# Patient Record
Sex: Male | Born: 1985 | Race: White | Hispanic: No | Marital: Married | Smoking: Current every day smoker
Health system: Southern US, Community
[De-identification: ages and names within clinical notes are randomized; demographics above are authoritative.]

## PROBLEM LIST (undated history)

## (undated) DIAGNOSIS — R002 Palpitations: Secondary | ICD-10-CM

## (undated) DIAGNOSIS — IMO0001 Reserved for inherently not codable concepts without codable children: Secondary | ICD-10-CM

## (undated) DIAGNOSIS — R030 Elevated blood-pressure reading, without diagnosis of hypertension: Secondary | ICD-10-CM

## (undated) HISTORY — DX: Reserved for inherently not codable concepts without codable children: IMO0001

## (undated) HISTORY — PX: TYMPANOSTOMY TUBE PLACEMENT: SHX32

## (undated) HISTORY — DX: Palpitations: R00.2

## (undated) HISTORY — DX: Elevated blood-pressure reading, without diagnosis of hypertension: R03.0

---

## 2006-07-13 HISTORY — PX: KNEE SURGERY: SHX244

## 2007-03-02 ENCOUNTER — Emergency Department (HOSPITAL_COMMUNITY): Admission: EM | Admit: 2007-03-02 | Discharge: 2007-03-02 | Payer: Self-pay | Admitting: Emergency Medicine

## 2007-03-19 ENCOUNTER — Ambulatory Visit: Payer: Self-pay | Admitting: Emergency Medicine

## 2007-09-28 ENCOUNTER — Emergency Department: Payer: Self-pay | Admitting: Emergency Medicine

## 2007-09-28 ENCOUNTER — Other Ambulatory Visit: Payer: Self-pay

## 2007-10-18 ENCOUNTER — Emergency Department: Payer: Self-pay | Admitting: Emergency Medicine

## 2007-10-25 ENCOUNTER — Emergency Department: Payer: Self-pay | Admitting: Emergency Medicine

## 2008-05-05 ENCOUNTER — Emergency Department: Payer: Self-pay | Admitting: Emergency Medicine

## 2008-10-16 ENCOUNTER — Emergency Department: Payer: Self-pay | Admitting: Emergency Medicine

## 2008-11-21 ENCOUNTER — Emergency Department: Payer: Self-pay | Admitting: Emergency Medicine

## 2008-12-24 ENCOUNTER — Ambulatory Visit: Payer: Self-pay | Admitting: Family Medicine

## 2008-12-24 DIAGNOSIS — R002 Palpitations: Secondary | ICD-10-CM

## 2008-12-24 DIAGNOSIS — M25579 Pain in unspecified ankle and joints of unspecified foot: Secondary | ICD-10-CM | POA: Insufficient documentation

## 2008-12-24 DIAGNOSIS — M79609 Pain in unspecified limb: Secondary | ICD-10-CM

## 2008-12-24 DIAGNOSIS — R000 Tachycardia, unspecified: Secondary | ICD-10-CM

## 2009-01-02 ENCOUNTER — Encounter: Payer: Self-pay | Admitting: Cardiology

## 2009-01-02 ENCOUNTER — Ambulatory Visit: Payer: Self-pay | Admitting: Cardiology

## 2009-01-11 ENCOUNTER — Telehealth: Payer: Self-pay | Admitting: Cardiology

## 2009-01-11 LAB — CONVERTED CEMR LAB
CO2: 22 meq/L (ref 19–32)
Calcium: 9.9 mg/dL (ref 8.4–10.5)
Chloride: 105 meq/L (ref 96–112)
Creatinine, Ser: 0.93 mg/dL (ref 0.40–1.50)
Glucose, Bld: 76 mg/dL (ref 70–99)
Sodium: 139 meq/L (ref 135–145)
TSH: 2.621 microintl units/mL (ref 0.350–4.500)

## 2009-01-13 ENCOUNTER — Emergency Department: Payer: Self-pay | Admitting: Emergency Medicine

## 2009-01-22 ENCOUNTER — Ambulatory Visit: Payer: Self-pay | Admitting: Family Medicine

## 2009-01-22 DIAGNOSIS — R21 Rash and other nonspecific skin eruption: Secondary | ICD-10-CM

## 2009-01-24 ENCOUNTER — Encounter: Payer: Self-pay | Admitting: Cardiology

## 2009-02-14 ENCOUNTER — Encounter: Payer: Self-pay | Admitting: Cardiology

## 2009-03-11 ENCOUNTER — Ambulatory Visit: Payer: Self-pay | Admitting: Cardiology

## 2009-04-01 ENCOUNTER — Ambulatory Visit: Payer: Self-pay | Admitting: Family Medicine

## 2009-04-01 DIAGNOSIS — K644 Residual hemorrhoidal skin tags: Secondary | ICD-10-CM | POA: Insufficient documentation

## 2009-10-09 ENCOUNTER — Ambulatory Visit (HOSPITAL_COMMUNITY): Admission: RE | Admit: 2009-10-09 | Discharge: 2009-10-09 | Payer: Self-pay | Admitting: Orthopedic Surgery

## 2010-12-19 ENCOUNTER — Encounter: Payer: Self-pay | Admitting: Cardiovascular Disease

## 2011-04-10 IMAGING — CR DG FOOT COMPLETE 3+V*L*
1 series · 3 of 3 positions shown · non-contrast
Comparison: none

REASON FOR EXAM: fall off horse,extreme dorsiflexion, now ongoing pain -
mid foot
COMMENTS:

[Series 1: view not recorded · 0.17mm/px · 3 of 3 slices shown]
[im 1/3]
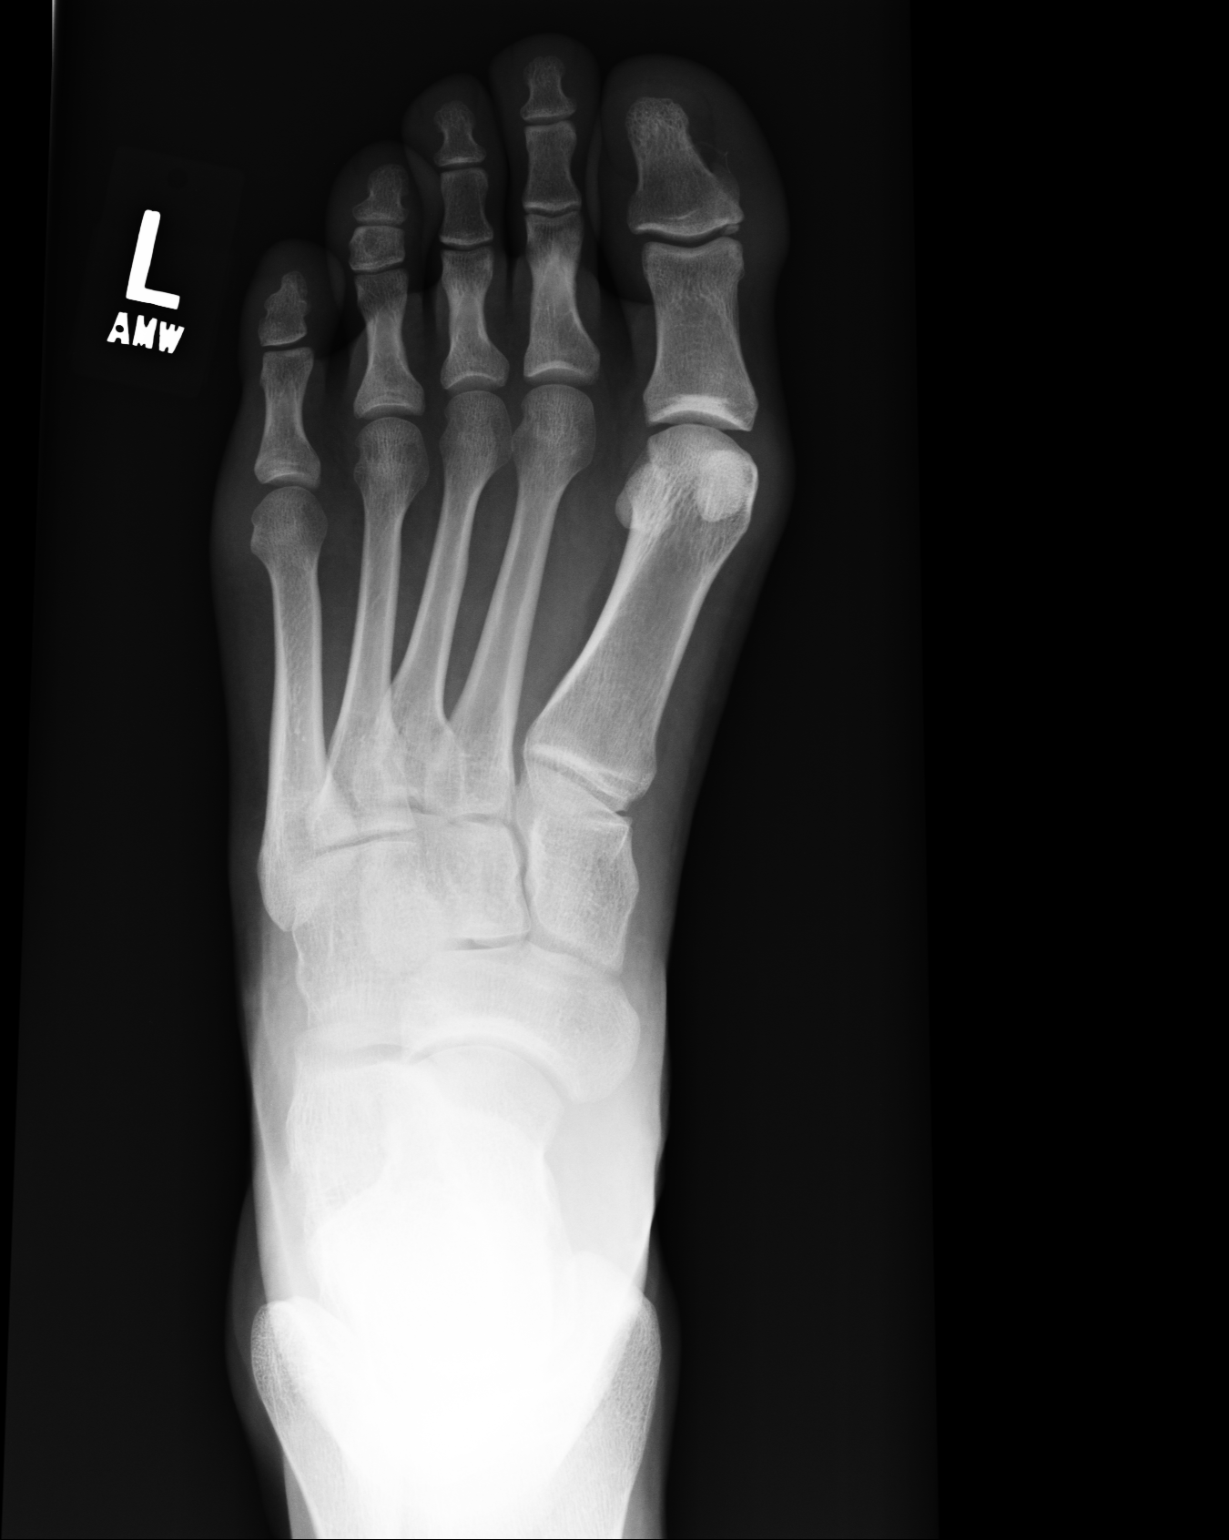
[im 2/3]
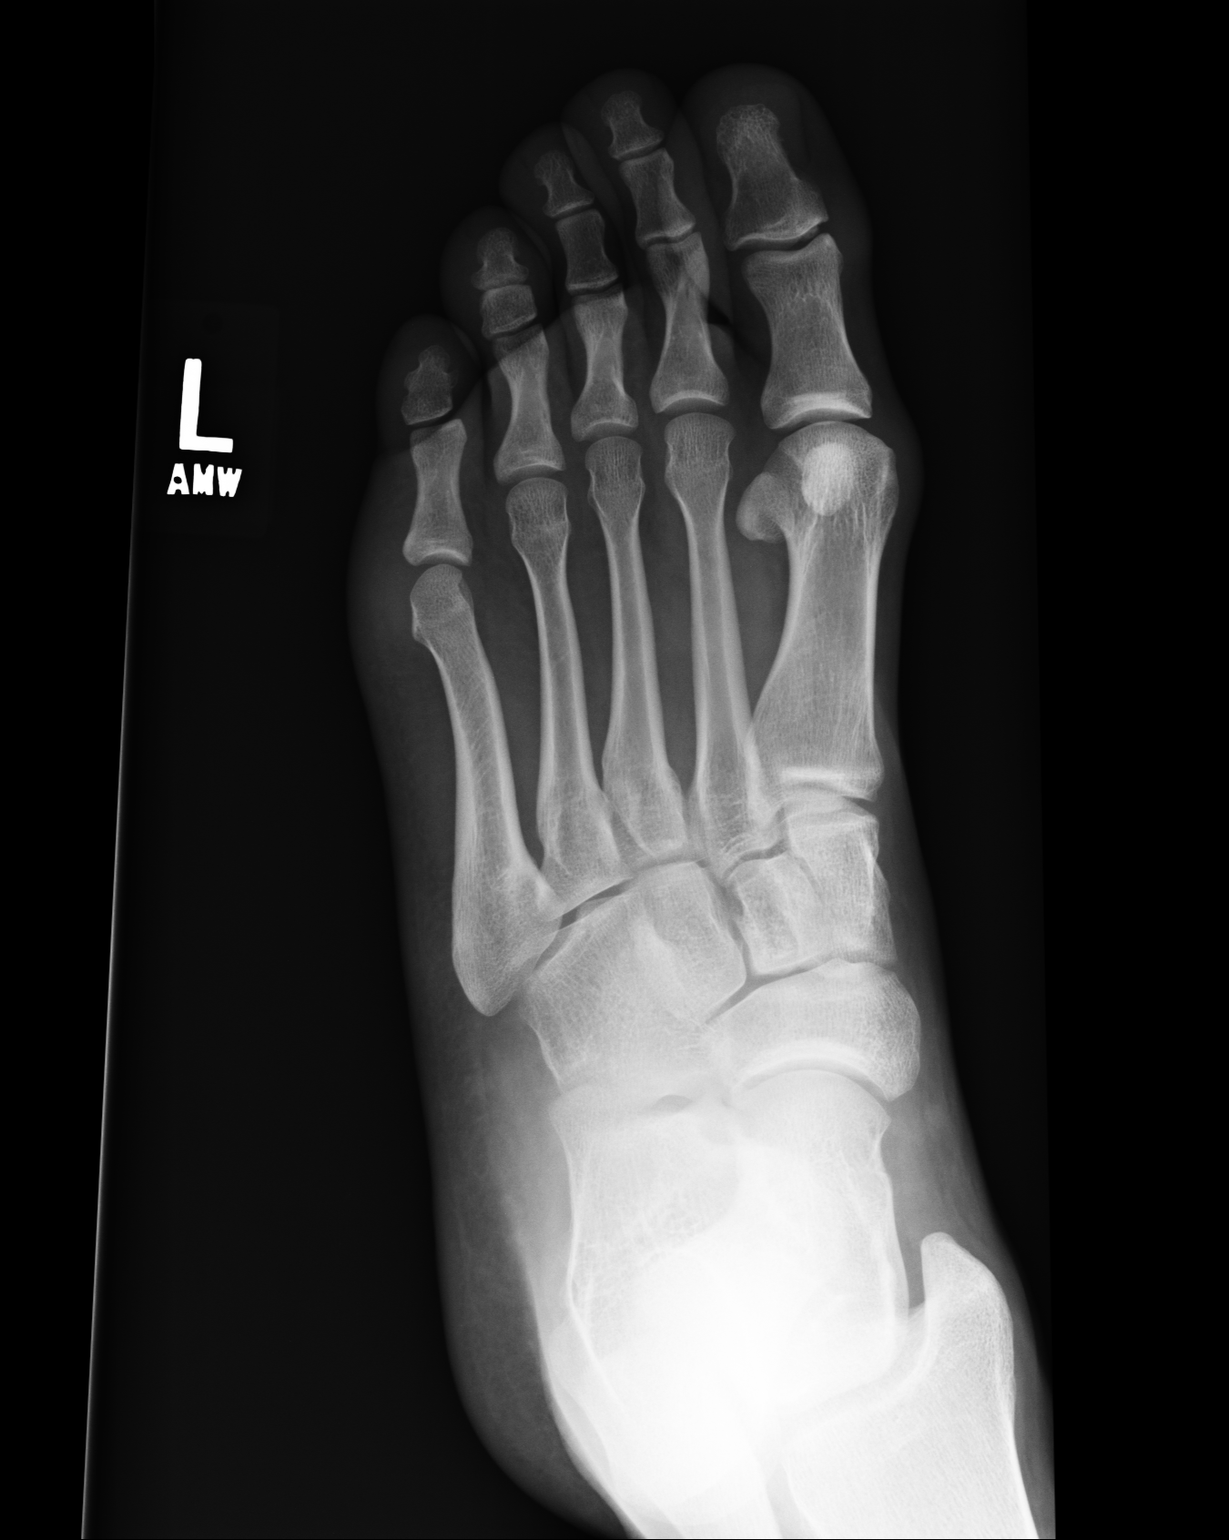
[im 3/3]
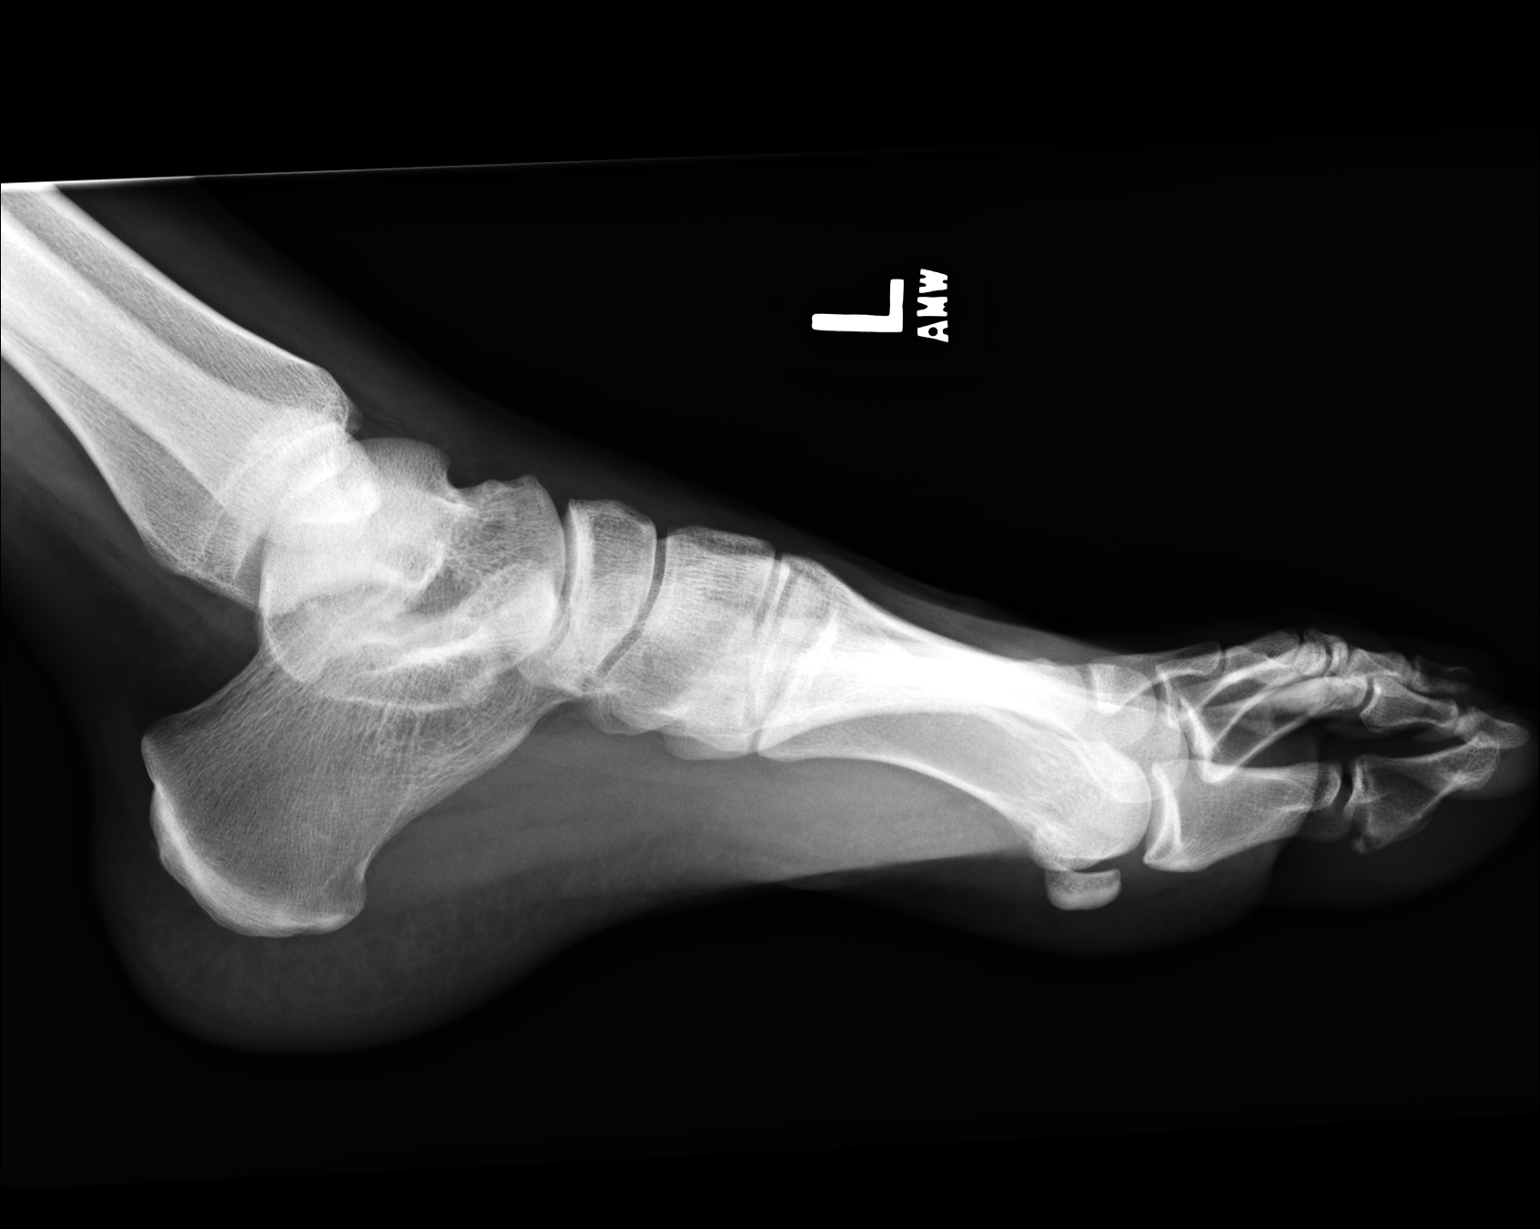

[3 of 3 positions shown; findings below may reference images not displayed]

PROCEDURE:     DXR - DXR FOOT LT COMP W/OBLIQUES  - November 21, 2008  [DATE]

RESULT:     Three views of the left foot reveal the phalanges and
metatarsals to be intact. The distal tarsal bones also appear normal. The
calcaneus is intact. As best as can be determined the talus is intact. The
overlying soft tissues appear within the limits of normal.
IMPRESSION: I do not see objective evidence of an acute fracture of the
bones of the foot. If the patient's symptoms warrant further evaluation, CT
scanning would be a useful next step.

## 2014-02-24 ENCOUNTER — Encounter (HOSPITAL_COMMUNITY): Payer: Self-pay | Admitting: Emergency Medicine

## 2014-02-24 ENCOUNTER — Emergency Department (HOSPITAL_COMMUNITY)
Admission: EM | Admit: 2014-02-24 | Discharge: 2014-02-24 | Disposition: A | Discharge status: Home / Self Care | Payer: Self-pay | Attending: Emergency Medicine | Admitting: Emergency Medicine

## 2014-02-24 DIAGNOSIS — F17200 Nicotine dependence, unspecified, uncomplicated: Secondary | ICD-10-CM | POA: Insufficient documentation

## 2014-02-24 DIAGNOSIS — R21 Rash and other nonspecific skin eruption: Secondary | ICD-10-CM | POA: Insufficient documentation

## 2014-02-24 DIAGNOSIS — IMO0002 Reserved for concepts with insufficient information to code with codable children: Secondary | ICD-10-CM | POA: Insufficient documentation

## 2014-02-24 DIAGNOSIS — J45909 Unspecified asthma, uncomplicated: Secondary | ICD-10-CM | POA: Insufficient documentation

## 2014-02-24 MED ORDER — PREDNISONE 50 MG PO TABS: ORAL_TABLET | ORAL | Status: DC

## 2014-02-24 NOTE — Discharge Instructions (Signed)

## 2014-02-24 NOTE — ED Notes (Signed)
Pt reports itching  Red rash that developed on bilateral lower legs 1 week ago. States that is has progressively moved "all over my body" with exception of groin area and face. Denies pain. States "I have tried everything and it won't go away." States he works outside.
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
## 2014-06-04 ENCOUNTER — Emergency Department: Payer: Self-pay | Admitting: Emergency Medicine

## 2014-06-29 ENCOUNTER — Emergency Department: Payer: Self-pay | Admitting: Emergency Medicine

## 2014-09-07 ENCOUNTER — Encounter (HOSPITAL_BASED_OUTPATIENT_CLINIC_OR_DEPARTMENT_OTHER): Payer: Self-pay

## 2014-09-07 ENCOUNTER — Emergency Department (HOSPITAL_BASED_OUTPATIENT_CLINIC_OR_DEPARTMENT_OTHER)
Admission: EM | Admit: 2014-09-07 | Discharge: 2014-09-07 | Disposition: A | Discharge status: Home / Self Care | Payer: Self-pay | Attending: Emergency Medicine | Admitting: Emergency Medicine

## 2014-09-07 DIAGNOSIS — Z720 Tobacco use: Secondary | ICD-10-CM | POA: Insufficient documentation

## 2014-09-07 DIAGNOSIS — J45909 Unspecified asthma, uncomplicated: Secondary | ICD-10-CM | POA: Insufficient documentation

## 2014-09-07 DIAGNOSIS — R51 Headache: Secondary | ICD-10-CM | POA: Insufficient documentation

## 2014-09-07 DIAGNOSIS — K529 Noninfective gastroenteritis and colitis, unspecified: Secondary | ICD-10-CM | POA: Insufficient documentation

## 2014-09-07 LAB — CBC WITH DIFFERENTIAL/PLATELET
BASOS ABS: 0 10*3/uL (ref 0.0–0.1)
Basophils Relative: 0 % (ref 0–1)
EOS ABS: 0.1 10*3/uL (ref 0.0–0.7)
EOS PCT: 1 % (ref 0–5)
HEMATOCRIT: 53 % — AB (ref 39.0–52.0)
Hemoglobin: 18.3 g/dL — ABNORMAL HIGH (ref 13.0–17.0)
LYMPHS PCT: 11 % — AB (ref 12–46)
Lymphs Abs: 1.1 10*3/uL (ref 0.7–4.0)
MCH: 30.1 pg (ref 26.0–34.0)
MCHC: 34.5 g/dL (ref 30.0–36.0)
MCV: 87.3 fL (ref 78.0–100.0)
MONO ABS: 1.2 10*3/uL — AB (ref 0.1–1.0)
Monocytes Relative: 12 % (ref 3–12)
Neutro Abs: 8 10*3/uL — ABNORMAL HIGH (ref 1.7–7.7)
Neutrophils Relative %: 76 % (ref 43–77)
PLATELETS: 164 10*3/uL (ref 150–400)
RBC: 6.07 MIL/uL — ABNORMAL HIGH (ref 4.22–5.81)
RDW: 13.7 % (ref 11.5–15.5)
WBC: 10.5 10*3/uL (ref 4.0–10.5)

## 2014-09-07 LAB — HEPATIC FUNCTION PANEL
ALBUMIN: 4.4 g/dL (ref 3.5–5.2)
ALK PHOS: 76 U/L (ref 39–117)
ALT: 26 U/L (ref 0–53)
AST: 24 U/L (ref 0–37)
BILIRUBIN DIRECT: 0.2 mg/dL (ref 0.0–0.5)
BILIRUBIN TOTAL: 0.9 mg/dL (ref 0.3–1.2)
Indirect Bilirubin: 0.7 mg/dL (ref 0.3–0.9)
Total Protein: 7.9 g/dL (ref 6.0–8.3)

## 2014-09-07 LAB — BASIC METABOLIC PANEL
Anion gap: 2 — ABNORMAL LOW (ref 5–15)
BUN: 11 mg/dL (ref 6–23)
CHLORIDE: 107 mmol/L (ref 96–112)
CO2: 24 mmol/L (ref 19–32)
CREATININE: 0.96 mg/dL (ref 0.50–1.35)
Calcium: 8.9 mg/dL (ref 8.4–10.5)
GFR calc Af Amer: >90 mL/min (ref >90)
GFR calc non Af Amer: >90 mL/min (ref >90)
GLUCOSE: 100 mg/dL — AB (ref 70–99)
POTASSIUM: 3.7 mmol/L (ref 3.5–5.1)
Sodium: 133 mmol/L — ABNORMAL LOW (ref 135–145)

## 2014-09-07 LAB — LIPASE, BLOOD: Lipase: 21 U/L (ref 11–59)

## 2014-09-07 MED ORDER — SODIUM CHLORIDE 0.9 % IV BOLUS (SEPSIS)
1000.0000 mL | Freq: Once | INTRAVENOUS | Status: AC
  Administered 2014-09-07: 1000 mL via INTRAVENOUS

## 2014-09-07 MED ORDER — ONDANSETRON HCL 4 MG/2ML IJ SOLN
4.0000 mg | Freq: Once | INTRAMUSCULAR | Status: AC
  Administered 2014-09-07: 4 mg via INTRAVENOUS
  Filled 2014-09-07: qty 2

## 2014-09-07 MED ORDER — ONDANSETRON 4 MG PO TBDP: ORAL_TABLET | ORAL | Status: DC
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
## 2014-09-07 NOTE — ED Notes (Signed)
Dr. Criss AlvineGoldston requests pt not be d/c home until second liter of ivf is finished.

## 2014-09-07 NOTE — ED Notes (Signed)
Discharge instructions reviewed, signature obtained. Pt awaiting infusion of second liter of iv fluids for d/c. Will call this rn when bag has infused.

## 2014-09-07 NOTE — ED Notes (Signed)
MD at bedside. 

## 2014-09-07 NOTE — ED Notes (Signed)
Nausea, vomiting, diarrhea and myalgias x 2 days.  Sick contact.  Unable to hold down any PO fluids or food.

## 2015-12-02 ENCOUNTER — Encounter: Payer: Self-pay | Admitting: *Deleted

## 2015-12-02 ENCOUNTER — Emergency Department
Admission: EM | Admit: 2015-12-02 | Discharge: 2015-12-02 | Disposition: A | Discharge status: Home / Self Care | Payer: Self-pay | Attending: Emergency Medicine | Admitting: Emergency Medicine

## 2015-12-02 DIAGNOSIS — S60222A Contusion of left hand, initial encounter: Secondary | ICD-10-CM | POA: Insufficient documentation

## 2015-12-02 DIAGNOSIS — S60312A Abrasion of left thumb, initial encounter: Secondary | ICD-10-CM | POA: Insufficient documentation

## 2015-12-02 DIAGNOSIS — W268XXA Contact with other sharp object(s), not elsewhere classified, initial encounter: Secondary | ICD-10-CM | POA: Insufficient documentation

## 2015-12-02 DIAGNOSIS — Y999 Unspecified external cause status: Secondary | ICD-10-CM | POA: Insufficient documentation

## 2015-12-02 DIAGNOSIS — Y9389 Activity, other specified: Secondary | ICD-10-CM | POA: Insufficient documentation

## 2015-12-02 DIAGNOSIS — S61412A Laceration without foreign body of left hand, initial encounter: Secondary | ICD-10-CM | POA: Insufficient documentation

## 2015-12-02 DIAGNOSIS — J45909 Unspecified asthma, uncomplicated: Secondary | ICD-10-CM | POA: Insufficient documentation

## 2015-12-02 DIAGNOSIS — Y929 Unspecified place or not applicable: Secondary | ICD-10-CM | POA: Insufficient documentation

## 2015-12-02 DIAGNOSIS — F17210 Nicotine dependence, cigarettes, uncomplicated: Secondary | ICD-10-CM | POA: Insufficient documentation

## 2015-12-02 MED ORDER — TETANUS-DIPHTH-ACELL PERTUSSIS 5-2.5-18.5 LF-MCG/0.5 IM SUSP
0.5000 mL | Freq: Once | INTRAMUSCULAR | Status: AC
  Administered 2015-12-02: 0.5 mL via INTRAMUSCULAR
  Filled 2015-12-02: qty 0.5

## 2015-12-02 NOTE — ED Notes (Signed)
Pt cut left hand on lawn mower blade, no bleeding noted

## 2015-12-02 NOTE — Discharge Instructions (Signed)
Abrasion An abrasion is a cut or scrape on the surface of your skin. An abrasion does not go through all of the layers of your skin. It is important to take good care of your abrasion to prevent infection. HOME CARE Medicines  Take or apply medicines only as told by your doctor.  If you were prescribed an antibiotic ointment, finish all of it even if you start to feel better. Wound Care  Clean the wound with mild soap and water 2-3 times per day or as told by your doctor. Pat your wound dry with a clean towel. Do not rub it.  There are many ways to close and cover a wound. Follow instructions from your doctor about:  How to take care of your wound.  When and how you should change your bandage (dressing).  When and how you should take off your dressing.  Check your wound every day for signs of infection. Watch for:  Redness, swelling, or pain.  Fluid, blood, or pus. General Instructions  Keep the dressing dry as told by your doctor. Do not take baths, swim, use a hot tub, or do anything that would put your wound underwater until your doctor says it is okay.  If there is swelling, raise (elevate) the injured area above the level of your heart while you are sitting or lying down.  Keep all follow-up visits as told by your doctor. This is important. GET HELP IF:  You were given a tetanus shot and you have any of these where the needle went in:  Swelling.  Very bad pain.  Redness.  Bleeding.  Medicine does not help your pain.  You have any of these at the site of the wound:  More redness.  More swelling.  More pain. GET HELP RIGHT AWAY IF:  You have a red streak going away from your wound.  You have a fever.  You have fluid, blood, or pus coming from your wound.  There is a bad smell coming from your wound.   This information is not intended to replace advice given to you by your health care provider. Make sure you discuss any questions you have with your  health care provider.   Document Released: 12/16/2007 Document Revised: 11/13/2014 Document Reviewed: 06/27/2014 Elsevier Interactive Patient Education 2016 [employer]   Laceration Care, Adult A laceration is a cut that goes through all layers of the skin. The cut also goes into the tissue that is right under the skin. Some cuts heal on their own. Others need to be closed with stitches (sutures), staples, skin adhesive strips, or wound glue. Taking care of your cut lowers your risk of infection and helps your cut to heal better. HOW TO TAKE CARE OF YOUR CUT For stitches or staples:  Keep the wound clean and dry.  If you were given a bandage (dressing), you should change it at least one time per day or as told by your doctor. You should also change it if it gets wet or dirty.  Keep the wound completely dry for the first 24 hours or as told by your doctor. After that time, you may take a shower or a bath. However, make sure that the wound is not soaked in water until after the stitches or staples have been removed.  Clean the wound one time each day or as told by your doctor:  Wash the wound with soap and water.  Rinse the wound with water until all of the soap  comes off.  Pat the wound dry with a clean towel. Do not rub the wound.  After you clean the wound, put a thin layer of antibiotic ointment on it as told by your doctor. This ointment:  Helps to prevent infection.  Keeps the bandage from sticking to the wound.  Have your stitches or staples removed as told by your doctor. If your doctor used skin adhesive strips:   Keep the wound clean and dry.  If you were given a bandage, you should change it at least one time per day or as told by your doctor. You should also change it if it gets dirty or wet.  Do not get the skin adhesive strips wet. You can take a shower or a bath, but be careful to keep the wound dry.  If the wound gets wet, pat it dry with a clean towel. Do not  rub the wound.  Skin adhesive strips fall off on their own. You can trim the strips as the wound heals. Do not remove any strips that are still stuck to the wound. They will fall off after a while. If your doctor used wound glue:  Try to keep your wound dry, but you may briefly wet it in the shower or bath. Do not soak the wound in water, such as by swimming.  After you take a shower or a bath, gently pat the wound dry with a clean towel. Do not rub the wound.  Do not do any activities that will make you really sweaty until the skin glue has fallen off on its own.  Do not apply liquid, cream, or ointment medicine to your wound while the skin glue is still on.  If you were given a bandage, you should change it at least one time per day or as told by your doctor. You should also change it if it gets dirty or wet.  If a bandage is placed over the wound, do not let the tape for the bandage touch the skin glue.  Do not pick at the glue. The skin glue usually stays on for 5-10 days. Then, it falls off of the skin. General Instructions  To help prevent scarring, make sure to cover your wound with sunscreen whenever you are outside after stitches are removed, after adhesive strips are removed, or when wound glue stays in place and the wound is healed. Make sure to wear a sunscreen of at least 30 SPF.  Take over-the-counter and prescription medicines only as told by your doctor.  If you were given antibiotic medicine or ointment, take or apply it as told by your doctor. Do not stop using the antibiotic even if your wound is getting better.  Do not scratch or pick at the wound.  Keep all follow-up visits as told by your doctor. This is important.  Check your wound every day for signs of infection. Watch for:  Redness, swelling, or pain.  Fluid, blood, or pus.  Raise (elevate) the injured area above the level of your heart while you are sitting or lying down, if possible. GET HELP  IF:  You got a tetanus shot and you have any of these problems at the injection site:  Swelling.  Very bad pain.  Redness.  Bleeding.  You have a fever.  A wound that was closed breaks open.  You notice a bad smell coming from your wound or your bandage.  You notice something coming out of the wound, such as wood or  glass.  Medicine does not help your pain.  You have more redness, swelling, or pain at the site of your wound.  You have fluid, blood, or pus coming from your wound.  You notice a change in the color of your skin near your wound.  You need to change the bandage often because fluid, blood, or pus is coming from the wound.  You start to have a new rash.  You start to have numbness around the wound. GET HELP RIGHT AWAY IF:  You have very bad swelling around the wound.  Your pain suddenly gets worse and is very bad.  You notice painful lumps near the wound or on skin that is anywhere on your body.  You have a red streak going away from your wound.  The wound is on your hand or foot and you cannot move a finger or toe like you usually can.  The wound is on your hand or foot and you notice that your fingers or toes look pale or bluish.   This information is not intended to replace advice given to you by your health care provider. Make sure you discuss any questions you have with your health care provider.   Document Released: 12/16/2007 Document Revised: 11/13/2014 Document Reviewed: 06/25/2014 Elsevier Interactive Patient Education 2016 [employer] Inc.  Hand Contusion  A hand contusion is a deep bruise to the hand. Contusions happen when an injury causes bleeding under the skin. Signs of bruising include pain, puffiness (swelling), and discolored skin. The contusion may turn blue, purple, or yellow. HOME CARE  Put ice on the injured area.  Put ice in a plastic bag.  Place a towel between your skin and the bag.  Leave the ice on for 15-20 minutes,  03-04 times a day.  Only take medicines as told by your doctor.  Use an elastic wrap only as told. You may remove the wrap for sleeping, showering, and bathing. Take the wrap off if you lose feeling (have numbness) in your fingers, or they turn blue or cold. Put the wrap on more loosely.  Keep the hand raised (elevated) with pillows.  Avoid using your hand too much if it is painful. GET HELP RIGHT AWAY IF:   You have more redness, puffiness, or pain in your hand.  Your puffiness or pain does not get better with medicine.  You lose feeling in your hand, or you cannot move your fingers.  Your hand turns cold or blue.  You have pain when you move your fingers.  Your hand feels warm.  Your contusion does not get better in 2 days. MAKE SURE YOU:   Understand these instructions.  Will watch this condition.  Will get help right away if you are not doing well or you get worse.   This information is not intended to replace advice given to you by your health care provider. Make sure you discuss any questions you have with your health care provider.   Document Released: 12/16/2007 Document Revised: 07/20/2014 Document Reviewed: 12/21/2011 Elsevier Interactive Patient Education 2016 [employer].  Stitches, Hewitt, or Adhesive Wound Closure Doctors use stitches (sutures), staples, and certain glue (skin adhesives) to hold your skin together while it heals (wound closure). You may need this treatment after you have surgery or if you cut your skin accidentally. These methods help your skin heal more quickly. They also make it less likely that you will have a scar. WHAT ARE THE DIFFERENT KINDS OF WOUND CLOSURES? There are  many options for wound closure. The one that your doctor uses depends on how deep and large your wound is. Adhesive Glue To use this glue to close a wound, your doctor holds the edges of the wound together and paints the glue on the surface of your skin. You may need  more than one layer of glue. Then the wound may be covered with a light bandage (dressing). This type of skin closure may be used for small wounds that are not deep (superficial). Using glue for wound closure is less painful than other methods. It does not require a medicine that numbs the area. This method also leaves nothing to be removed. Adhesive glue is often used for children and on facial wounds. Adhesive glue cannot be used for wounds that are deep, uneven, or bleeding. It is not used inside of a wound.  Adhesive Strips These strips are made of sticky (adhesive), porous paper. They are placed across your skin edges like a regular adhesive bandage. You leave them on until they fall off. Adhesive strips may be used to close very superficial wounds. They may also be used along with sutures to improve closure of your skin edges.  Sutures Sutures are the oldest method of wound closure. Sutures can be made from natural or synthetic materials. They can be made from a material that your body can break down as your wound heals (absorbable), or they can be made from a material that needs to be removed from your skin (nonabsorbable). They come in many different strengths and sizes. Your doctor attaches the sutures to a steel needle on one end. Sutures can be passed through your skin, or through the tissues beneath your skin. Then they are tied and cut. Your skin edges may be closed in one continuous stitch or in separate stitches. Sutures are strong and can be used for all kinds of wounds. Absorbable sutures may be used to close tissues under the skin. The disadvantage of sutures is that they may cause skin reactions that lead to infection. Nonabsorbable sutures need to be removed. Staples When surgical staples are used to close a wound, the edges of your skin on both sides of the wound are brought close together. A staple is placed across the wound, and an instrument secures the edges together. Staples are  often used to close surgical cuts (incisions). Staples are faster to use than sutures, and they cause less reaction from your skin. Staples need to be removed using a tool that bends the staples away from your skin. HOW DO I CARE FOR MY WOUND CLOSURE?  Take medicines only as told by your doctor.  If you were prescribed an antibiotic medicine for your wound, finish it all even if you start to feel better.  Use ointments or creams only as told by your doctor.  Wash your hands with soap and water before and after touching your wound.  Do not soak your wound in water. Do not take baths, swim, or use a hot tub until your doctor says it is okay.  Ask your doctor when you can start showering. Cover your wound if told by your doctor.  Do not take out your own sutures or staples.  Do not pick at your wound. Picking can cause an infection.  Keep all follow-up visits as told by your doctor. This is important. HOW LONG WILL I HAVE MY WOUND CLOSURE?   Leave adhesive glue on your skin until the glue peels away.  Leave adhesive  strips on your skin until they fall off.  Absorbable sutures will dissolve within several days.  Nonabsorbable sutures and staples must be removed. The location of the wound will determine how long they stay in. This can range from several days to a couple of weeks. WHEN SHOULD I SEEK HELP FOR MY WOUND CLOSURE? Contact your doctor if:  You have a fever.  You have chills.  You have redness, puffiness (swelling), or pain at the site of your wound.  You have fluid, blood, or pus coming from your wound.  There is a bad smell coming from your wound.  The skin edges of your wound start to separate after your sutures have been removed.  Your wound becomes thick, raised, and darker in color after your sutures come out (scarring).   This information is not intended to replace advice given to you by your health care provider. Make sure you discuss any questions you have  with your health care provider.   Document Released: 04/26/2009 Document Revised: 07/20/2014 Document Reviewed: 12/06/2013 Elsevier Interactive Patient Education 2016 Elsevier Inc.  Cryotherapy Cryotherapy is when you put ice on your injury. Ice helps lessen pain and puffiness (swelling) after an injury. Ice works the best when you start using it in the first 24 to 48 hours after an injury. HOME CARE  Put a dry or damp towel between the ice pack and your skin.  You may press gently on the ice pack.  Leave the ice on for no more than 10 to 20 minutes at a time.  Check your skin after 5 minutes to make sure your skin is okay.  Rest at least 20 minutes between ice pack uses.  Stop using ice when your skin loses feeling (numbness).  Do not use ice on someone who cannot tell you when it hurts. This includes small children and people with memory problems (dementia). GET HELP RIGHT AWAY IF:  You have white spots on your skin.  Your skin turns blue or pale.  Your skin feels waxy or hard.  Your puffiness gets worse. MAKE SURE YOU:   Understand these instructions.  Will watch your condition.  Will get help right away if you are not doing well or get worse.   This information is not intended to replace advice given to you by your health care provider. Make sure you discuss any questions you have with your health care provider.   Document Released: 12/16/2007 Document Revised: 09/21/2011 Document Reviewed: 02/19/2011 Elsevier Interactive Patient Education [employer]
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
## 2016-08-25 ENCOUNTER — Emergency Department
Admission: EM | Admit: 2016-08-25 | Discharge: 2016-08-25 | Disposition: A | Discharge status: Home / Self Care | Payer: 59 | Attending: Emergency Medicine | Admitting: Emergency Medicine

## 2016-08-25 ENCOUNTER — Encounter: Payer: Self-pay | Admitting: Emergency Medicine

## 2016-08-25 ENCOUNTER — Emergency Department: Payer: 59

## 2016-08-25 DIAGNOSIS — M25562 Pain in left knee: Secondary | ICD-10-CM | POA: Diagnosis not present

## 2016-08-25 DIAGNOSIS — J45909 Unspecified asthma, uncomplicated: Secondary | ICD-10-CM | POA: Insufficient documentation

## 2016-08-25 DIAGNOSIS — F17210 Nicotine dependence, cigarettes, uncomplicated: Secondary | ICD-10-CM | POA: Insufficient documentation

## 2016-08-25 DIAGNOSIS — G8918 Other acute postprocedural pain: Secondary | ICD-10-CM | POA: Diagnosis present

## 2016-08-25 MED ORDER — MELOXICAM 7.5 MG PO TABS: 7.5000 mg | ORAL_TABLET | Freq: Every day | ORAL | 1 refills | Status: AC
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
## 2016-08-25 NOTE — ED Notes (Signed)
Pt reports left knee pain - pt denies any injury to the knee - states he woke up yesterday morning with sharp pain shooting into left knee and radiating into left hip - no redness or swelling - painful with manipulation - difficulty bearing weight on that knee
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
## 2016-09-01 ENCOUNTER — Other Ambulatory Visit: Payer: Self-pay | Admitting: Orthopedic Surgery

## 2016-09-01 DIAGNOSIS — S8392XA Sprain of unspecified site of left knee, initial encounter: Secondary | ICD-10-CM

## 2016-09-01 DIAGNOSIS — M2392 Unspecified internal derangement of left knee: Secondary | ICD-10-CM

## 2016-09-14 ENCOUNTER — Ambulatory Visit
Admission: RE | Admit: 2016-09-14 | Discharge: 2016-09-14 | Disposition: A | Discharge status: Home / Self Care | Payer: 59 | Source: Ambulatory Visit | Attending: Orthopedic Surgery | Admitting: Orthopedic Surgery

## 2016-09-14 DIAGNOSIS — M2392 Unspecified internal derangement of left knee: Secondary | ICD-10-CM | POA: Diagnosis not present

## 2016-09-14 DIAGNOSIS — S8392XA Sprain of unspecified site of left knee, initial encounter: Secondary | ICD-10-CM

## 2016-10-10 ENCOUNTER — Emergency Department (HOSPITAL_COMMUNITY)
Admission: EM | Admit: 2016-10-10 | Discharge: 2016-10-10 | Disposition: A | Discharge status: Home / Self Care | Payer: 59 | Attending: Emergency Medicine | Admitting: Emergency Medicine

## 2016-10-10 ENCOUNTER — Encounter (HOSPITAL_COMMUNITY): Payer: Self-pay

## 2016-10-10 DIAGNOSIS — H18891 Other specified disorders of cornea, right eye: Secondary | ICD-10-CM | POA: Insufficient documentation

## 2016-10-10 DIAGNOSIS — F17210 Nicotine dependence, cigarettes, uncomplicated: Secondary | ICD-10-CM | POA: Insufficient documentation

## 2016-10-10 DIAGNOSIS — H1589 Other disorders of sclera: Secondary | ICD-10-CM | POA: Diagnosis not present

## 2016-10-10 DIAGNOSIS — J45909 Unspecified asthma, uncomplicated: Secondary | ICD-10-CM | POA: Diagnosis not present

## 2016-10-10 DIAGNOSIS — H579 Unspecified disorder of eye and adnexa: Secondary | ICD-10-CM

## 2016-10-10 DIAGNOSIS — H5789 Other specified disorders of eye and adnexa: Secondary | ICD-10-CM

## 2016-10-10 DIAGNOSIS — H578 Other specified disorders of eye and adnexa: Secondary | ICD-10-CM | POA: Diagnosis present

## 2016-10-10 MED ORDER — TETRACAINE HCL 0.5 % OP SOLN
1.0000 [drp] | Freq: Once | OPHTHALMIC | Status: AC
  Administered 2016-10-10: 1 [drp] via OPHTHALMIC
  Filled 2016-10-10: qty 2

## 2016-10-10 MED ORDER — KETOROLAC TROMETHAMINE 0.5 % OP SOLN: 1.0000 [drp] | Freq: Four times a day (QID) | OPHTHALMIC | 0 refills | Status: DC

## 2016-10-10 MED ORDER — FLUORESCEIN SODIUM 0.6 MG OP STRP
1.0000 | ORAL_STRIP | Freq: Once | OPHTHALMIC | Status: AC
  Administered 2016-10-10: 1 via OPHTHALMIC
  Filled 2016-10-10: qty 1

## 2016-10-10 NOTE — ED Triage Notes (Addendum)
Pt coming in with right eye redness and swelling. Pt went to urgent care on Wednesday and was diagnosed with pink eye and given eye drops. Pt states that redness has worsened and medication has not been helpful. Pt states some occasional blurry vision to right eye.

## 2016-10-10 NOTE — ED Notes (Signed)
See providers assessment.  

## 2016-10-10 NOTE — ED Notes (Signed)
Pt stable, understands discharge instructions, and reasons for return.   

## 2016-10-10 NOTE — Discharge Instructions (Signed)
As we discussed your eye symptoms are most likely viral in nature.  We do not suspect bacterial conjunctivitis, cornea or scleral abrasions, ulcers or acute angle glaucoma.    Viral conjunctivitis is usually a self limiting condition.  Typically goes away without intervention.  Please use ketorolac drops for comfort and inflammation.  Protect your eye from wind, sun, debris.  Avoid touching or rubbing your eye as this will worsen inflammation.    Call ophthalmology and please be re-evaluated in 2-3 days to ensure your symptoms are improving.    Return to the emergency department immediately if you notice purulent, yellow eye discharge, changes in vision, ocular or facial swelling or redness, nausea, vomiting or severe headache.
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
## 2017-04-12 ENCOUNTER — Emergency Department
Admission: EM | Admit: 2017-04-12 | Discharge: 2017-04-13 | Disposition: A | Discharge status: Other Acute Inpatient Hospital | Payer: 59 | Attending: Emergency Medicine | Admitting: Emergency Medicine

## 2017-04-12 DIAGNOSIS — F10920 Alcohol use, unspecified with intoxication, uncomplicated: Secondary | ICD-10-CM

## 2017-04-12 DIAGNOSIS — F1020 Alcohol dependence, uncomplicated: Secondary | ICD-10-CM

## 2017-04-12 DIAGNOSIS — F332 Major depressive disorder, recurrent severe without psychotic features: Secondary | ICD-10-CM | POA: Diagnosis not present

## 2017-04-12 DIAGNOSIS — J45909 Unspecified asthma, uncomplicated: Secondary | ICD-10-CM | POA: Diagnosis not present

## 2017-04-12 DIAGNOSIS — F10929 Alcohol use, unspecified with intoxication, unspecified: Secondary | ICD-10-CM | POA: Insufficient documentation

## 2017-04-12 DIAGNOSIS — R45851 Suicidal ideations: Secondary | ICD-10-CM | POA: Insufficient documentation

## 2017-04-12 DIAGNOSIS — R52 Pain, unspecified: Secondary | ICD-10-CM

## 2017-04-12 DIAGNOSIS — F17210 Nicotine dependence, cigarettes, uncomplicated: Secondary | ICD-10-CM | POA: Insufficient documentation

## 2017-04-12 DIAGNOSIS — F1994 Other psychoactive substance use, unspecified with psychoactive substance-induced mood disorder: Secondary | ICD-10-CM

## 2017-04-12 LAB — CBC
HEMATOCRIT: 53.5 % — AB (ref 40.0–52.0)
HEMOGLOBIN: 18.4 g/dL — AB (ref 13.0–18.0)
MCH: 31.2 pg (ref 26.0–34.0)
MCHC: 34.4 g/dL (ref 32.0–36.0)
MCV: 90.6 fL (ref 80.0–100.0)
Platelets: 264 10*3/uL (ref 150–440)
RBC: 5.91 MIL/uL — ABNORMAL HIGH (ref 4.40–5.90)
RDW: 14.4 % (ref 11.5–14.5)
WBC: 12.5 10*3/uL — AB (ref 3.8–10.6)

## 2017-04-12 LAB — URINE DRUG SCREEN, QUALITATIVE (ARMC ONLY)
AMPHETAMINES, UR SCREEN: NOT DETECTED
BARBITURATES, UR SCREEN: NOT DETECTED
BENZODIAZEPINE, UR SCRN: NOT DETECTED
Cannabinoid 50 Ng, Ur ~~LOC~~: NOT DETECTED
Cocaine Metabolite,Ur ~~LOC~~: NOT DETECTED
MDMA (Ecstasy)Ur Screen: NOT DETECTED
METHADONE SCREEN, URINE: NOT DETECTED
OPIATE, UR SCREEN: NOT DETECTED
Phencyclidine (PCP) Ur S: NOT DETECTED
TRICYCLIC, UR SCREEN: NOT DETECTED

## 2017-04-12 LAB — COMPREHENSIVE METABOLIC PANEL
ALT: 31 U/L (ref 17–63)
ANION GAP: 9 (ref 5–15)
AST: 28 U/L (ref 15–41)
Albumin: 4.4 g/dL (ref 3.5–5.0)
Alkaline Phosphatase: 104 U/L (ref 38–126)
BUN: 7 mg/dL (ref 6–20)
CO2: 26 mmol/L (ref 22–32)
Calcium: 9.2 mg/dL (ref 8.9–10.3)
Chloride: 102 mmol/L (ref 101–111)
Creatinine, Ser: 0.98 mg/dL (ref 0.61–1.24)
GFR calc non Af Amer: >60 mL/min (ref >60)
GLUCOSE: 108 mg/dL — AB (ref 65–99)
POTASSIUM: 4 mmol/L (ref 3.5–5.1)
SODIUM: 137 mmol/L (ref 135–145)
TOTAL PROTEIN: 8.2 g/dL — AB (ref 6.5–8.1)
Total Bilirubin: 0.5 mg/dL (ref 0.3–1.2)

## 2017-04-12 LAB — ETHANOL: Alcohol, Ethyl (B): 297 mg/dL — ABNORMAL HIGH (ref <10)

## 2017-04-12 LAB — ACETAMINOPHEN LEVEL

## 2017-04-12 LAB — SALICYLATE LEVEL
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
## 2017-04-12 NOTE — ED Notes (Signed)
Patient verbally contracts for safety with this RN at this time.

## 2017-04-12 NOTE — ED Notes (Signed)
Pt Belongings Bag: Pants, Pair of socks, Pair of shoes, Belt, Bracelet, Piercings: Left and Right nipple, Bottom lip, tongue, nasal septum, Left ear. Pt bag given to Triage RN who gave it to wife.

## 2017-04-13 ENCOUNTER — Inpatient Hospital Stay
Admission: AD | Admit: 2017-04-13 | Discharge: 2017-04-14 | DRG: 885 | Disposition: A | Discharge status: Home / Self Care | Payer: 59 | Attending: Psychiatry | Admitting: Psychiatry

## 2017-04-13 ENCOUNTER — Emergency Department: Payer: 59

## 2017-04-13 DIAGNOSIS — G4700 Insomnia, unspecified: Secondary | ICD-10-CM | POA: Diagnosis present

## 2017-04-13 DIAGNOSIS — J45909 Unspecified asthma, uncomplicated: Secondary | ICD-10-CM | POA: Diagnosis present

## 2017-04-13 DIAGNOSIS — F332 Major depressive disorder, recurrent severe without psychotic features: Secondary | ICD-10-CM

## 2017-04-13 DIAGNOSIS — R45851 Suicidal ideations: Secondary | ICD-10-CM | POA: Diagnosis present

## 2017-04-13 DIAGNOSIS — F17210 Nicotine dependence, cigarettes, uncomplicated: Secondary | ICD-10-CM | POA: Diagnosis present

## 2017-04-13 DIAGNOSIS — W228XXA Striking against or struck by other objects, initial encounter: Secondary | ICD-10-CM | POA: Diagnosis present

## 2017-04-13 DIAGNOSIS — F10129 Alcohol abuse with intoxication, unspecified: Secondary | ICD-10-CM | POA: Diagnosis present

## 2017-04-13 DIAGNOSIS — S62652A Nondisplaced fracture of medial phalanx of right middle finger, initial encounter for closed fracture: Secondary | ICD-10-CM | POA: Diagnosis present

## 2017-04-13 DIAGNOSIS — Z811 Family history of alcohol abuse and dependence: Secondary | ICD-10-CM | POA: Diagnosis not present

## 2017-04-13 DIAGNOSIS — F1020 Alcohol dependence, uncomplicated: Secondary | ICD-10-CM

## 2017-04-13 DIAGNOSIS — F10929 Alcohol use, unspecified with intoxication, unspecified: Secondary | ICD-10-CM | POA: Diagnosis not present

## 2017-04-13 DIAGNOSIS — F10229 Alcohol dependence with intoxication, unspecified: Secondary | ICD-10-CM | POA: Diagnosis present

## 2017-04-13 DIAGNOSIS — F322 Major depressive disorder, single episode, severe without psychotic features: Secondary | ICD-10-CM | POA: Insufficient documentation

## 2017-04-13 DIAGNOSIS — F1994 Other psychoactive substance use, unspecified with psychoactive substance-induced mood disorder: Secondary | ICD-10-CM

## 2017-04-13 DIAGNOSIS — F17200 Nicotine dependence, unspecified, uncomplicated: Secondary | ICD-10-CM | POA: Diagnosis present

## 2017-04-13 MED ORDER — THIAMINE HCL 100 MG/ML IJ SOLN: 100.0000 mg | Freq: Every day | INTRAMUSCULAR | Status: DC

## 2017-04-13 MED ORDER — LORAZEPAM 2 MG/ML IJ SOLN: 1.0000 mg | Freq: Four times a day (QID) | INTRAMUSCULAR | Status: DC | PRN

## 2017-04-13 MED ORDER — ALUM & MAG HYDROXIDE-SIMETH 200-200-20 MG/5ML PO SUSP: 30.0000 mL | ORAL | Status: DC | PRN

## 2017-04-13 MED ORDER — LORAZEPAM 1 MG PO TABS
1.0000 mg | ORAL_TABLET | Freq: Four times a day (QID) | ORAL | Status: DC | PRN
Start: 2017-04-13 — End: 2017-04-13

## 2017-04-13 MED ORDER — ADULT MULTIVITAMIN W/MINERALS CH: 1.0000 | ORAL_TABLET | Freq: Every day | ORAL | Status: DC

## 2017-04-13 MED ORDER — CHLORDIAZEPOXIDE HCL 25 MG PO CAPS
25.0000 mg | ORAL_CAPSULE | Freq: Four times a day (QID) | ORAL | Status: DC
  Administered 2017-04-13 – 2017-04-14 (×4): 25 mg via ORAL
  Filled 2017-04-13 (×4): qty 1

## 2017-04-13 MED ORDER — VITAMIN B-1 100 MG PO TABS
100.0000 mg | ORAL_TABLET | Freq: Every day | ORAL | Status: DC
  Administered 2017-04-14: 100 mg via ORAL
  Filled 2017-04-13: qty 1

## 2017-04-13 MED ORDER — ADULT MULTIVITAMIN W/MINERALS CH
1.0000 | ORAL_TABLET | Freq: Every day | ORAL | Status: DC
  Administered 2017-04-14: 1 via ORAL
  Filled 2017-04-13: qty 1

## 2017-04-13 MED ORDER — FOLIC ACID 1 MG PO TABS: 1.0000 mg | ORAL_TABLET | Freq: Every day | ORAL | Status: DC

## 2017-04-13 MED ORDER — VITAMIN B-1 100 MG PO TABS: 100.0000 mg | ORAL_TABLET | Freq: Every day | ORAL | Status: DC

## 2017-04-13 MED ORDER — TRAZODONE HCL 100 MG PO TABS: 100.0000 mg | ORAL_TABLET | Freq: Every evening | ORAL | Status: DC | PRN

## 2017-04-13 MED ORDER — MAGNESIUM HYDROXIDE 400 MG/5ML PO SUSP: 30.0000 mL | Freq: Every day | ORAL | Status: DC | PRN

## 2017-04-13 MED ORDER — HYDROXYZINE HCL 50 MG PO TABS: 50.0000 mg | ORAL_TABLET | Freq: Three times a day (TID) | ORAL | Status: DC | PRN

## 2017-04-13 MED ORDER — NICOTINE 21 MG/24HR TD PT24
21.0000 mg | MEDICATED_PATCH | Freq: Every day | TRANSDERMAL | Status: DC
  Filled 2017-04-13: qty 1

## 2017-04-13 MED ORDER — ACETAMINOPHEN 325 MG PO TABS: 650.0000 mg | ORAL_TABLET | Freq: Four times a day (QID) | ORAL | Status: DC | PRN

## 2017-04-13 MED ORDER — LORAZEPAM 1 MG PO TABS: 1.0000 mg | ORAL_TABLET | Freq: Four times a day (QID) | ORAL | Status: DC | PRN

## 2017-04-13 MED ORDER — FOLIC ACID 1 MG PO TABS
1.0000 mg | ORAL_TABLET | Freq: Every day | ORAL | Status: DC
  Administered 2017-04-14: 1 mg via ORAL
  Filled 2017-04-13: qty 1

## 2017-04-13 NOTE — ED Notes (Signed)
Mom called and gave password for update on patient.

## 2017-04-13 NOTE — ED Notes (Signed)
Received a call from someone stating they know his pass word and they want to know his information   passcode given but I cannot hardly hear them on the phone  - HIPPA privacy maintained for this pt  - I stated the guidelines to our phone times and our visiting times - I did not give any personal information out - the lady on the phone stated  " When will the doctor call me for information on why y'all should keep him."  Why is it different there as to Dakota Burns ER" - I explained what privacy means  And she yelled  "i know that  - I am a nurse"  I asked if she would like me to take her name and number and I can document a request for the doctor to call her for colateral information but I cannot guarantee that he will call due to pt being an adult she stated  "Never mind - I will take care of this myself"  She then hung up    Spoke with pt about his privacy and he requests that all call come to him
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
## 2017-04-13 NOTE — ED Notes (Signed)
BEHAVIORAL HEALTH ROUNDING Patient sleeping: No. Patient alert and oriented: yes Behavior appropriate: Yes.  ; If no, describe:  Nutrition and fluids offered: yes Toileting and hygiene offered: Yes  Sitter present: q15 minute observations and security  monitoring Law enforcement present: Yes  ODS  

## 2017-04-13 NOTE — ED Notes (Signed)
ED Is the patient under IVC or is there intent for IVC: Yes.   Is the patient medically cleared: Yes.   Is there vacancy in the ED BHU: Yes.   Is the population mix appropriate for patient: Yes.   Is the patient awaiting placement in inpatient or outpatient setting: Yes.   Has the patient had a psychiatric consult:  Consult pending   Survey of unit performed for contraband, proper placement and condition of furniture, tampering with fixtures in bathroom, shower, and each patient room: Yes.  ; Findings:  APPEARANCE/BEHAVIOR Calm and cooperative NEURO ASSESSMENT Orientation: oriented x 4 Denies pain Hallucinations: No.None noted (Hallucinations) Speech: Normal Gait: normal RESPIRATORY ASSESSMENT Even  Unlabored respirations  CARDIOVASCULAR ASSESSMENT Pulses equal   regular rate  Skin warm and dry   GASTROINTESTINAL ASSESSMENT no GI complaint EXTREMITIES Full ROM  PLAN OF CARE Provide calm/safe environment. Vital signs assessed twice daily. ED BHU Assessment once each 12-hour shift. Collaborate with TTS daily or as condition indicates. Assure the ED provider has rounded once each shift. Provide and encourage hygiene. Provide redirection as needed. Assess for escalating behavior; address immediately and inform ED provider.  Assess family dynamic and appropriateness for visitation as needed: Yes.  ; If necessary, describe findings:  Educate the patient/family about BHU procedures/visitation: Yes.  ; If necessary, describe findings:   

## 2017-04-13 NOTE — ED Notes (Signed)
Patient observed lying in bed with eyes closed  Even, unlabored respirations observed   NAD pt appears to be sleeping  I will continue to monitor along with every 15 minute visual observations and ongoing security monitoring    

## 2017-04-13 NOTE — Progress Notes (Signed)
Pt is very pleasant. Cooperative and appropriate. Has minimal interaction with peers. Denies SI, HI or A/V hallucinations. Pt states he just something stupid after drinking a very large amount of alcohol. He states he wasn't really going to harm himself nor does the feel he needs to be here. Pt says he is hoping he will be discharged tomorrow Pt is med complaint, no adverse noted. Pt is on a CIWA a 6 hrs, no complications present. Pt contracted to safety. 15 min safety checks continues.

## 2017-04-13 NOTE — Plan of Care (Signed)
Problem: Physical Regulation: Goal: Complications related to the disease process, condition or treatment will be avoided or minimized Outcome: Progressing Pt will not have any complications related to alcohol abuse this shift.

## 2017-04-13 NOTE — ED Notes (Signed)
Report called and given to [place], Charity fundraiser. Pt verbalized understanding of transfer. Pt transfer by wheelchair with officer and nurse. No s/sx of distress noted or reported. All belongings transferred with patient.

## 2017-04-13 NOTE — ED Notes (Signed)
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
## 2017-04-13 NOTE — BH Assessment (Signed)
Patient is to be admitted to [facility] by Dr. Toni Amend.  Attending Physician will be Dr. Jennet Maduro.   Patient has been assigned to room 303, by [facility] Charge Nurse [place] F.   Intake Paper Work has been signed and placed on patient chart.  ER staff is aware of the admission Rivka Barbara, ER Sect.; Dr. Mayford Knife, ER MD; Vic Ripper., Patient's Nurse & Garfield Cornea, Patient Access).
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
## 2017-04-13 NOTE — ED Notes (Signed)
Pt given lunch tray.

## 2017-04-13 NOTE — ED Notes (Signed)
Received report from Amy T. RN 

## 2017-04-13 NOTE — Tx Team (Signed)
Initial Treatment Plan 04/13/2017 6:45 PM [name] ZOX:[id number]    PATIENT STRESSORS: Marital or family conflict Substance abuse   PATIENT STRENGTHS: Average or above average intelligence Communication skills Supportive family/friends Work skills   PATIENT IDENTIFIED PROBLEMS: Alcohol abuse 10/2 2018  Suicidal ideations 04/13/2017                   DISCHARGE CRITERIA:  Ability to meet basic life and health needs Adequate post-discharge living arrangements Motivation to continue treatment in a less acute level of care Verbal commitment to aftercare and medication compliance  PRELIMINARY DISCHARGE PLAN: Attend aftercare/continuing care group Return to previous living arrangement Return to previous work or school arrangements  PATIENT/FAMILY INVOLVEMENT: This treatment plan has been presented to and reviewed with the patient, Dakota Burns, and/or family member, *  The patient and family have been given the opportunity to ask questions and make suggestions.  Leonarda Salon, RN 04/13/2017, 6:45 PM
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
## 2017-04-13 NOTE — Progress Notes (Signed)
Patient pleasant and cooperative during admission assessment. Patient denies SI/HI at this time. Patient denies AVH. Patient informed of fall risk status, fall risk assessed "low" at this time. Patient oriented to unit/staff/room. Patient denies any questions/concerns at this time. Patient safe on unit with Q15 minute checks for safety. Skin assessment and body search done.No contraband found. 

## 2017-04-14 DIAGNOSIS — F10129 Alcohol abuse with intoxication, unspecified: Secondary | ICD-10-CM | POA: Diagnosis present

## 2017-04-14 DIAGNOSIS — F332 Major depressive disorder, recurrent severe without psychotic features: Principal | ICD-10-CM

## 2017-04-14 LAB — HEMOGLOBIN A1C
Hgb A1c MFr Bld: 4.6 % — ABNORMAL LOW (ref 4.8–5.6)
MEAN PLASMA GLUCOSE: 85.32 mg/dL

## 2017-04-14 LAB — LIPID PANEL
CHOL/HDL RATIO: 3.5 ratio
CHOLESTEROL: 204 mg/dL — AB (ref 0–200)
HDL: 58 mg/dL (ref >40)
LDL Cholesterol: 120 mg/dL — ABNORMAL HIGH (ref 0–99)
TRIGLYCERIDES: 131 mg/dL (ref <150)
VLDL: 26 mg/dL (ref 0–40)

## 2017-04-14 LAB — TSH: TSH: 1.806 u[IU]/mL (ref 0.350–4.500)
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
## 2017-04-14 NOTE — Discharge Instructions (Addendum)
Kartel, please do not remove cast or wrapping until after your visit on April 20, 2017 with Dr. Dedra Skeens. Please take a sponge bath or wrap your your arm in plastic and secure the top of your arm with tape to keep it dry.

## 2017-04-14 NOTE — Plan of Care (Signed)
Problem: Education: Goal: Emotional status will improve Outcome: Progressing Lonald denied all psychiatric symptoms this am.   Problem: Physical Regulation: Goal: Complications related to the disease process, condition or treatment will be avoided or minimized Outcome: Progressing Dempsey CIWA score was Zero this am.

## 2017-04-14 NOTE — [facility] Suicide Risk Assessment (Signed)
[facility] INPATIENT:  Family/Significant Other Suicide Prevention Education  Suicide Prevention Education:  Patient Refusal for Family/Significant Other Suicide Prevention Education: The patient Dakota Burns has refused to provide written consent for family/significant other to be provided Family/Significant Other Suicide Prevention Education during admission and/or prior to discharge.  Physician notified.  Cleda Daub Mechell Girgis 04/14/2017, 1:50 PM
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
## 2017-04-14 NOTE — Progress Notes (Signed)
Patient ID: Dakota Burns, male   DOB: 1985-09-29, 31 y.o.   MRN: [id number]  Dakota Burns received his discharge order, the AVS was reviewed and his questions answered. His personal belongings were returned. He was discharge with his friend without incident. He was instructed to keep his arm dry until after his appointment on April 20, 2017. The care of his arm was written in his discharge instructions and reviewed with him prior to his discharge.
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
## 2017-06-10 ENCOUNTER — Emergency Department: Payer: 59

## 2017-06-10 ENCOUNTER — Encounter: Payer: Self-pay | Admitting: Emergency Medicine

## 2017-06-10 ENCOUNTER — Emergency Department
Admission: EM | Admit: 2017-06-10 | Discharge: 2017-06-10 | Disposition: A | Discharge status: Home / Self Care | Payer: 59 | Attending: Student in an Organized Health Care Education/Training Program | Admitting: Student in an Organized Health Care Education/Training Program

## 2017-06-10 DIAGNOSIS — Y999 Unspecified external cause status: Secondary | ICD-10-CM | POA: Insufficient documentation

## 2017-06-10 DIAGNOSIS — J45909 Unspecified asthma, uncomplicated: Secondary | ICD-10-CM | POA: Diagnosis not present

## 2017-06-10 DIAGNOSIS — X501XXA Overexertion from prolonged static or awkward postures, initial encounter: Secondary | ICD-10-CM | POA: Insufficient documentation

## 2017-06-10 DIAGNOSIS — Y929 Unspecified place or not applicable: Secondary | ICD-10-CM | POA: Diagnosis not present

## 2017-06-10 DIAGNOSIS — S99912A Unspecified injury of left ankle, initial encounter: Secondary | ICD-10-CM | POA: Diagnosis present

## 2017-06-10 DIAGNOSIS — Y9389 Activity, other specified: Secondary | ICD-10-CM | POA: Insufficient documentation

## 2017-06-10 DIAGNOSIS — S93402A Sprain of unspecified ligament of left ankle, initial encounter: Secondary | ICD-10-CM | POA: Diagnosis not present

## 2017-06-10 DIAGNOSIS — F17210 Nicotine dependence, cigarettes, uncomplicated: Secondary | ICD-10-CM | POA: Insufficient documentation

## 2017-06-10 MED ORDER — MELOXICAM 15 MG PO TABS: 15.0000 mg | ORAL_TABLET | Freq: Every day | ORAL | 0 refills | Status: AC
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
## 2017-06-10 NOTE — ED Triage Notes (Signed)
Pt comes into the ED via POV  C/o left ankle pain after stepping off the footing of a porch incorrectly.  Patient states there was swelling last night and he has a h/o breaking this ankle in the past.  Patient ambulatory to triage at this time and in NAD.

## 2017-06-10 NOTE — ED Notes (Signed)
X-ray at bedside

## 2017-06-10 NOTE — ED Notes (Signed)

## 2018-02-24 ENCOUNTER — Other Ambulatory Visit: Payer: Self-pay

## 2018-02-24 ENCOUNTER — Emergency Department: Payer: Self-pay

## 2018-02-24 ENCOUNTER — Emergency Department
Admission: EM | Admit: 2018-02-24 | Discharge: 2018-02-24 | Disposition: A | Discharge status: Home / Self Care | Payer: Self-pay | Attending: Emergency Medicine | Admitting: Emergency Medicine

## 2018-02-24 DIAGNOSIS — F17210 Nicotine dependence, cigarettes, uncomplicated: Secondary | ICD-10-CM | POA: Insufficient documentation

## 2018-02-24 DIAGNOSIS — Z79899 Other long term (current) drug therapy: Secondary | ICD-10-CM | POA: Insufficient documentation

## 2018-02-24 DIAGNOSIS — R509 Fever, unspecified: Secondary | ICD-10-CM | POA: Insufficient documentation

## 2018-02-24 DIAGNOSIS — J189 Pneumonia, unspecified organism: Secondary | ICD-10-CM

## 2018-02-24 DIAGNOSIS — R070 Pain in throat: Secondary | ICD-10-CM | POA: Insufficient documentation

## 2018-02-24 DIAGNOSIS — R05 Cough: Secondary | ICD-10-CM | POA: Insufficient documentation

## 2018-02-24 DIAGNOSIS — J181 Lobar pneumonia, unspecified organism: Secondary | ICD-10-CM | POA: Insufficient documentation

## 2018-02-24 MED ORDER — IPRATROPIUM-ALBUTEROL 0.5-2.5 (3) MG/3ML IN SOLN
3.0000 mL | Freq: Once | RESPIRATORY_TRACT | Status: AC
  Administered 2018-02-24: 3 mL via RESPIRATORY_TRACT
  Filled 2018-02-24: qty 3

## 2018-02-24 MED ORDER — AZITHROMYCIN 250 MG PO TABS: ORAL_TABLET | ORAL | 0 refills | Status: AC

## 2018-02-24 MED ORDER — LIDOCAINE HCL (PF) 1 % IJ SOLN
5.0000 mL | Freq: Once | INTRAMUSCULAR | Status: AC
  Administered 2018-02-24: 5 mL
  Filled 2018-02-24: qty 5

## 2018-02-24 MED ORDER — ALBUTEROL SULFATE HFA 108 (90 BASE) MCG/ACT IN AERS: 2.0000 | INHALATION_SPRAY | Freq: Four times a day (QID) | RESPIRATORY_TRACT | 0 refills | Status: AC | PRN

## 2018-02-24 MED ORDER — PSEUDOEPH-BROMPHEN-DM 30-2-10 MG/5ML PO SYRP: 5.0000 mL | ORAL_SOLUTION | Freq: Four times a day (QID) | ORAL | 0 refills | Status: AC | PRN

## 2018-02-24 MED ORDER — CEFTRIAXONE SODIUM 1 G IJ SOLR
1.0000 g | Freq: Once | INTRAMUSCULAR | Status: AC
  Administered 2018-02-24: 1 g via INTRAMUSCULAR
  Filled 2018-02-24: qty 10

## 2018-02-24 NOTE — ED Triage Notes (Signed)
Pt c/o cough with sinus and chest congestion with green sputum for the past week.

## 2018-02-24 NOTE — ED Notes (Signed)
See triage note  Presents with cough and sinus congestion   States sx's started about 1 week ago  Low grade fever on arrival
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
## 2019-01-12 IMAGING — CR DG KNEE COMPLETE 4+V*L*
1 series · 4 of 4 positions shown · non-contrast
Comparison: None.

CLINICAL DATA: Acute pain

EXAM:
LEFT KNEE - COMPLETE 4+ VIEW

[Series 1: dg knee complete 4 views left · 0.14mm/px · 4 of 4 slices shown]
[im 1/4]
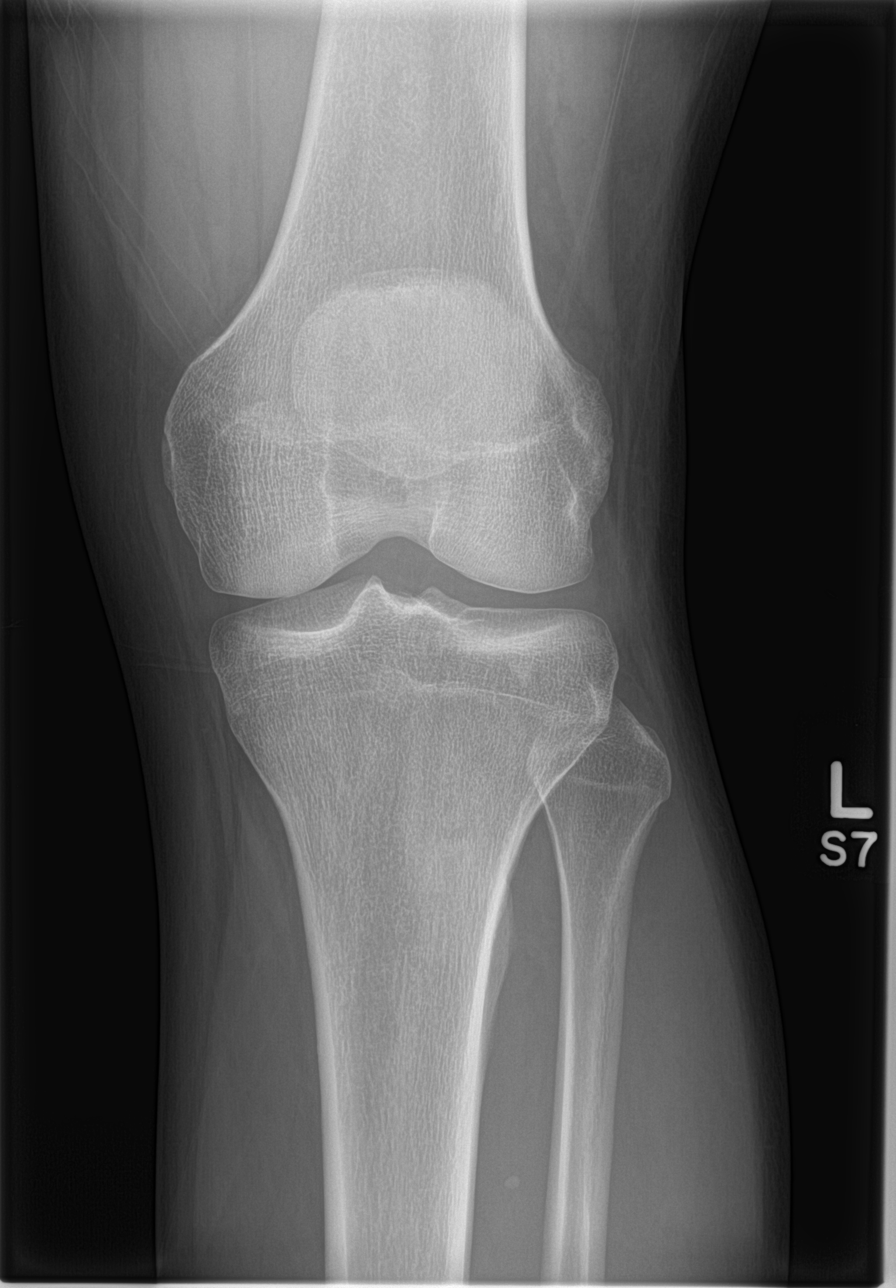
[im 2/4]
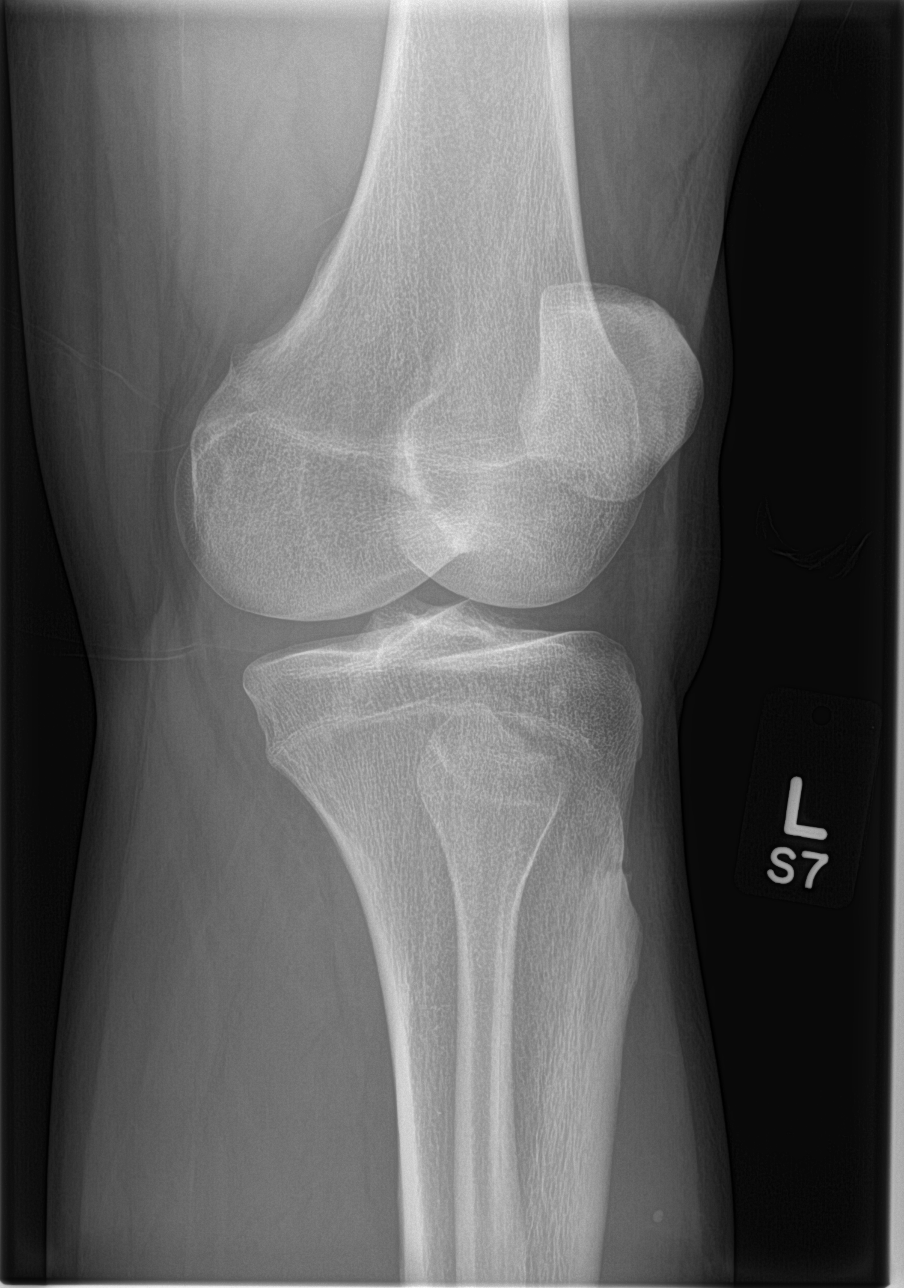
[im 3/4]
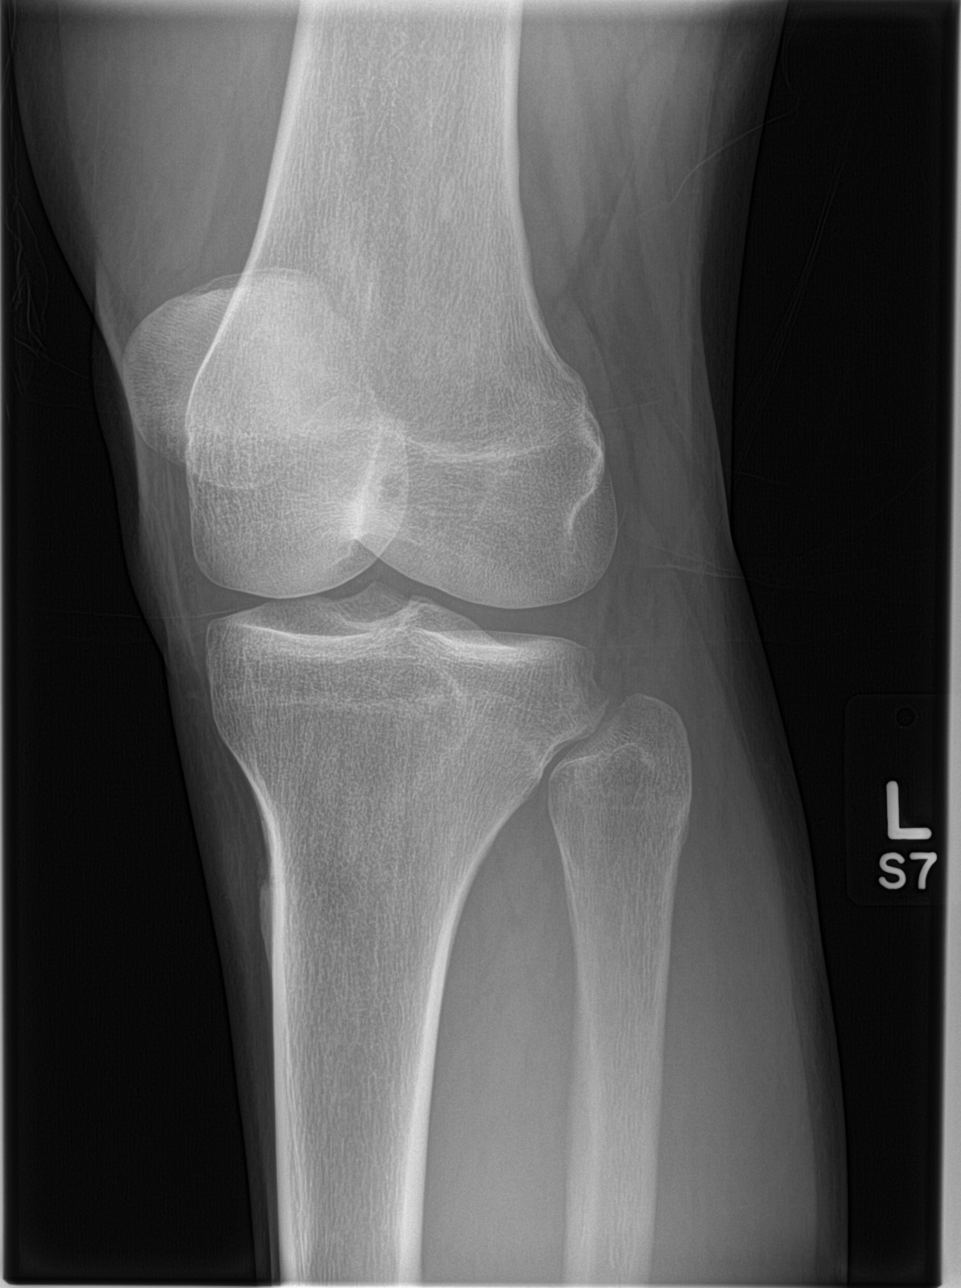
[im 4/4]
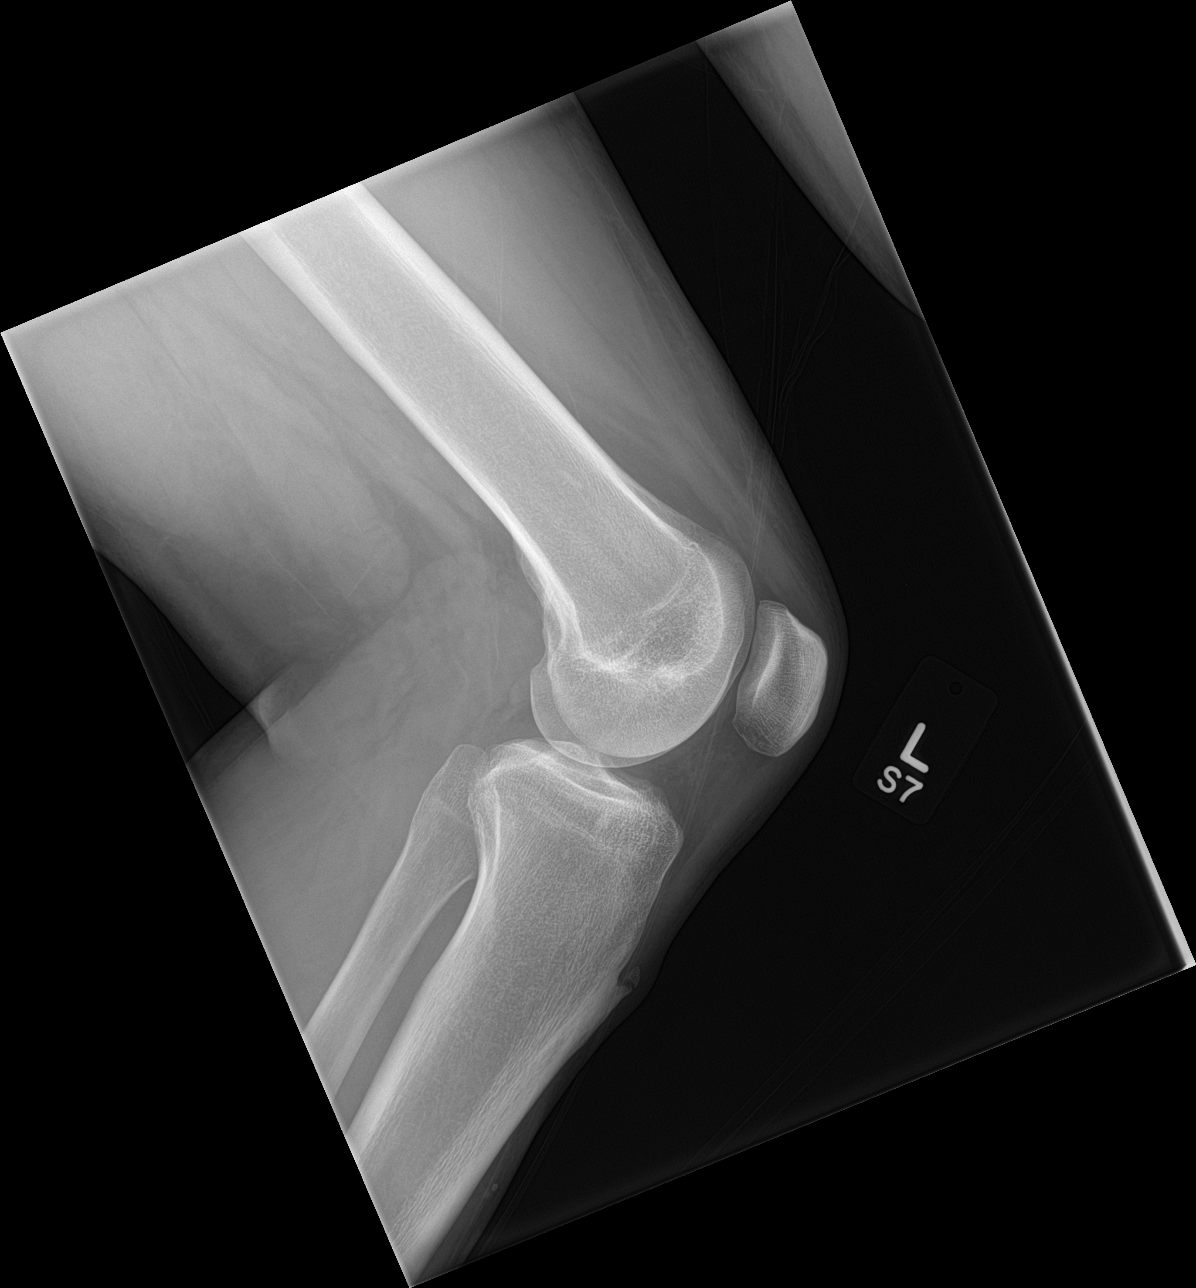

[4 of 4 positions shown; findings below may reference images not displayed]

FINDINGS: No evidence of fracture, dislocation, or joint effusion. No evidence
of arthropathy or other focal bone abnormality. Soft tissues are
unremarkable.
IMPRESSION: Negative.
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
## 2024-01-26 ENCOUNTER — Emergency Department
Admission: EM | Admit: 2024-01-26 | Discharge: 2024-01-26 | Disposition: A | Discharge status: Home / Self Care | Payer: Worker's Compensation | Attending: Emergency Medicine | Admitting: Emergency Medicine

## 2024-01-26 ENCOUNTER — Other Ambulatory Visit: Payer: Self-pay

## 2024-01-26 ENCOUNTER — Encounter: Payer: Self-pay | Admitting: Emergency Medicine

## 2024-01-26 DIAGNOSIS — S81811A Laceration without foreign body, right lower leg, initial encounter: Secondary | ICD-10-CM | POA: Insufficient documentation

## 2024-01-26 DIAGNOSIS — J45909 Unspecified asthma, uncomplicated: Secondary | ICD-10-CM | POA: Insufficient documentation

## 2024-01-26 DIAGNOSIS — W268XXA Contact with other sharp object(s), not elsewhere classified, initial encounter: Secondary | ICD-10-CM | POA: Insufficient documentation

## 2024-01-26 DIAGNOSIS — Y990 Civilian activity done for income or pay: Secondary | ICD-10-CM | POA: Diagnosis not present

## 2024-01-26 DIAGNOSIS — Z23 Encounter for immunization: Secondary | ICD-10-CM | POA: Diagnosis not present

## 2024-01-26 MED ORDER — LIDOCAINE-EPINEPHRINE 2 %-1:100000 IJ SOLN: 30.0000 mL | Freq: Once | INTRAMUSCULAR | Status: DC

## 2024-01-26 MED ORDER — LIDOCAINE-EPINEPHRINE 2 %-1:100000 IJ SOLN
20.0000 mL | Freq: Once | INTRAMUSCULAR | Status: AC
  Administered 2024-01-26: 20 mL
  Filled 2024-01-26: qty 1

## 2024-01-26 MED ORDER — TETANUS-DIPHTH-ACELL PERTUSSIS 5-2.5-18.5 LF-MCG/0.5 IM SUSY
0.5000 mL | PREFILLED_SYRINGE | Freq: Once | INTRAMUSCULAR | Status: AC
  Administered 2024-01-26: 0.5 mL via INTRAMUSCULAR
  Filled 2024-01-26: qty 0.5

## 2024-01-26 NOTE — ED Provider Notes (Signed)
 [facility] Provider Note    Event Date/Time   First MD Initiated Contact with Patient 01/26/24 1641     (approximate)   History   Laceration   HPI  Dakota Burns is a 38 y.o. male with PMH of asthma alcohol use disorder and substance induced mood disorder presents for evaluation of a laceration to the right lower leg.  Patient states he was at work when it was sliced by a piece of metal.  Patient reports his tetanus shot is not up-to-date.      Physical Exam   Triage Vital Signs: ED Triage Vitals [01/26/24 1459]  Encounter Vitals Group     BP (!) 156/106     Girls Systolic BP Percentile      Girls Diastolic BP Percentile      Boys Systolic BP Percentile      Boys Diastolic BP Percentile      Pulse Rate 95     Resp 17     Temp 98.6 F (37 C)     Temp Source Oral     SpO2 99 %     Weight 250 lb (113.4 kg)     Height 6' 2 (1.88 m)     Head Circumference      Peak Flow      Pain Score 4     Pain Loc      Pain Education      Exclude from Growth Chart     Most recent vital signs: Vitals:   01/26/24 1459  BP: (!) 156/106  Pulse: 95  Resp: 17  Temp: 98.6 F (37 C)  SpO2: 99%   General: Awake, no distress.  CV:  Good peripheral perfusion.  Resp:  Normal effort.  Abd:  No distention.  Other:  Approximately 6 cm laceration to the lateral right lower leg, slowly bleeding, subcutaneous fat exposed, no muscle tissue visualized, patient is neurovascularly intact distal to the wound.   ED Results / Procedures / Treatments   Labs (all labs ordered are listed, but only abnormal results are displayed) Labs Reviewed - No data to display   PROCEDURES:  Critical Care performed: No  .Laceration Repair  Date/Time: 01/26/2024 6:20 PM  Performed by: Dakota Tinnie LABOR, PA-C Authorized by: Dakota Tinnie LABOR, PA-C   Consent:    Consent obtained:  Verbal   Consent given by:  Patient   Risks, benefits, and alternatives were  discussed: yes     Risks discussed:  Infection, pain, poor cosmetic result and poor wound healing   Alternatives discussed:  No treatment Universal protocol:    Patient identity confirmed:  Verbally with patient Anesthesia:    Anesthesia method:  Local infiltration   Local anesthetic:  Lidocaine  2% WITH epi Laceration details:    Location:  Leg   Leg location:  R lower leg   Length (cm):  6   Depth (mm):  10 Pre-procedure details:    Preparation:  Patient was prepped and draped in usual sterile fashion Exploration:    Hemostasis achieved with:  Direct pressure   Wound exploration: wound explored through full range of motion and entire depth of wound visualized     Contaminated: no   Treatment:    Area cleansed with:  Povidone-iodine   Amount of cleaning:  Standard   Irrigation solution:  Sterile saline   Irrigation method:  Syringe   Debridement:  Minimal   Layers/structures repaired:  Deep subcutaneous Deep subcutaneous:  Suture size:  5-0   Suture material:  Monocryl   Suture technique:  Simple interrupted   Number of sutures:  9 Skin repair:    Repair method:  Sutures   Suture size:  4-0   Suture material:  Nylon   Suture technique:  Running   Number of sutures:  1 Approximation:    Approximation:  Close Repair type:    Repair type:  Simple Post-procedure details:    Dressing:  Bulky dressing   Procedure completion:  Tolerated well, no immediate complications    MEDICATIONS ORDERED IN ED: Medications  Tdap (BOOSTRIX ) injection 0.5 mL (has no administration in time range)  lidocaine -EPINEPHrine  (XYLOCAINE  W/EPI) 2 %-1:100000 (with pres) injection 20 mL (has no administration in time range)     IMPRESSION / MDM / ASSESSMENT AND PLAN / ED COURSE  I reviewed the triage vital signs and the nursing notes.                             38 year old male presents for evaluation of a laceration to the right lower leg.  Blood pressure is elevated otherwise vital  signs are stable.  Patient NAD on exam.  Differential diagnosis includes, but is not limited to, laceration, less likely tendon injury, nerve injury, vascular injury.  Patient's presentation is most consistent with acute, uncomplicated illness.  Tetanus shot updated today.  Laceration repaired as described in the procedure note above.  Patient was instructed on wound care.  We reviewed return precautions.  He voiced understanding, all questions were answered and he stable at discharge.    FINAL CLINICAL IMPRESSION(S) / ED DIAGNOSES   Final diagnoses:  Laceration of right lower extremity, initial encounter     Rx / DC Orders   ED Discharge Orders     None        Note:  This document was prepared using Dragon voice recognition software and may include unintentional dictation errors.   Dakota Tinnie LABOR, PA-C 01/26/24 Dakota Viviann Pastor, MD 01/26/24 [phone]

## 2024-01-26 NOTE — Discharge Instructions (Addendum)
 Tetanus shot was updated today.  Please wash the wound with soap and water daily.  Pat dry and then cover with a bandage.  Stitches will need to be removed in 7-10 days this can be done by primary care, urgent care or the emergency department.  Watch for signs of infection including redness, warmth, swelling, pain and pus drainage.  If you develop any of these please return to the ED, urgent care or your primary care provider.  You can take 650 mg of Tylenol  and 600 mg of ibuprofen every 6 hours as needed for pain.

## 2024-01-26 NOTE — ED Triage Notes (Addendum)
 Patient to ED via POV for laceration to right lower leg. Pt reports it got sliced by a piece of metal off a side of a house. Torniquet  placed on leg approx 1440. Bleeding controlled at this time.   Torniquet removed at this time and pressure bandage placed.
